# Patient Record
Sex: Male | Born: 1975 | Race: White | Hispanic: No | Marital: Single | Smoking: Never smoker
Health system: Southern US, Community
[De-identification: ages and names within clinical notes are randomized; demographics above are authoritative.]

## PROBLEM LIST (undated history)

## (undated) DIAGNOSIS — J45909 Unspecified asthma, uncomplicated: Secondary | ICD-10-CM

## (undated) DIAGNOSIS — J189 Pneumonia, unspecified organism: Secondary | ICD-10-CM

## (undated) DIAGNOSIS — I10 Essential (primary) hypertension: Secondary | ICD-10-CM

## (undated) DIAGNOSIS — R569 Unspecified convulsions: Secondary | ICD-10-CM

## (undated) DIAGNOSIS — F1920 Other psychoactive substance dependence, uncomplicated: Secondary | ICD-10-CM

## (undated) HISTORY — PX: LEG SURGERY: SHX1003

---

## 2009-08-28 ENCOUNTER — Emergency Department (HOSPITAL_COMMUNITY): Admission: EM | Admit: 2009-08-28 | Discharge: 2009-08-29 | Payer: Self-pay | Admitting: Emergency Medicine

## 2010-08-08 LAB — CBC
Platelets: 361 10*3/uL (ref 150–400)
WBC: 8.4 10*3/uL (ref 4.0–10.5)

## 2010-08-08 LAB — POCT I-STAT, CHEM 8
BUN: 9 mg/dL (ref 6–23)
Chloride: 104 mEq/L (ref 96–112)
HCT: 41 % (ref 39.0–52.0)
Sodium: 142 mEq/L (ref 135–145)
TCO2: 26 mmol/L (ref 0–100)

## 2010-08-08 LAB — DIFFERENTIAL
Eosinophils Relative: 2 % (ref 0–5)
Monocytes Absolute: 0.4 10*3/uL (ref 0.1–1.0)
Monocytes Relative: 5 % (ref 3–12)
Neutrophils Relative %: 71 % (ref 43–77)

## 2010-08-08 LAB — ETHANOL: Alcohol, Ethyl (B): <5 mg/dL (ref 0–10)

## 2010-08-08 LAB — RAPID URINE DRUG SCREEN, HOSP PERFORMED
Amphetamines: NOT DETECTED
Benzodiazepines: POSITIVE — AB

## 2014-06-07 ENCOUNTER — Emergency Department (HOSPITAL_COMMUNITY): Payer: Self-pay

## 2014-06-07 ENCOUNTER — Encounter (HOSPITAL_COMMUNITY): Payer: Self-pay | Admitting: Emergency Medicine

## 2014-06-07 ENCOUNTER — Emergency Department (HOSPITAL_COMMUNITY)
Admission: EM | Admit: 2014-06-07 | Discharge: 2014-06-08 | Disposition: A | Discharge status: Home / Self Care | Payer: Self-pay | Attending: Emergency Medicine | Admitting: Emergency Medicine

## 2014-06-07 DIAGNOSIS — F1910 Other psychoactive substance abuse, uncomplicated: Secondary | ICD-10-CM

## 2014-06-07 DIAGNOSIS — R000 Tachycardia, unspecified: Secondary | ICD-10-CM | POA: Insufficient documentation

## 2014-06-07 DIAGNOSIS — F13930 Sedative, hypnotic or anxiolytic use, unspecified with withdrawal, uncomplicated: Secondary | ICD-10-CM

## 2014-06-07 DIAGNOSIS — J45901 Unspecified asthma with (acute) exacerbation: Secondary | ICD-10-CM | POA: Insufficient documentation

## 2014-06-07 DIAGNOSIS — R062 Wheezing: Secondary | ICD-10-CM

## 2014-06-07 DIAGNOSIS — F13230 Sedative, hypnotic or anxiolytic dependence with withdrawal, uncomplicated: Secondary | ICD-10-CM

## 2014-06-07 DIAGNOSIS — Z8701 Personal history of pneumonia (recurrent): Secondary | ICD-10-CM | POA: Insufficient documentation

## 2014-06-07 DIAGNOSIS — I10 Essential (primary) hypertension: Secondary | ICD-10-CM | POA: Insufficient documentation

## 2014-06-07 DIAGNOSIS — F1110 Opioid abuse, uncomplicated: Secondary | ICD-10-CM | POA: Insufficient documentation

## 2014-06-07 DIAGNOSIS — F13239 Sedative, hypnotic or anxiolytic dependence with withdrawal, unspecified: Secondary | ICD-10-CM | POA: Insufficient documentation

## 2014-06-07 HISTORY — DX: Pneumonia, unspecified organism: J18.9

## 2014-06-07 HISTORY — DX: Essential (primary) hypertension: I10

## 2014-06-07 HISTORY — DX: Unspecified asthma, uncomplicated: J45.909

## 2014-06-07 HISTORY — DX: Other psychoactive substance dependence, uncomplicated: F19.20

## 2014-06-07 HISTORY — DX: Unspecified convulsions: R56.9

## 2014-06-07 LAB — ACETAMINOPHEN LEVEL: Acetaminophen (Tylenol), Serum: <10 ug/mL — ABNORMAL LOW (ref 10–30)

## 2014-06-07 LAB — RAPID URINE DRUG SCREEN, HOSP PERFORMED
Amphetamines: NOT DETECTED
BENZODIAZEPINES: POSITIVE — AB
Barbiturates: NOT DETECTED
Cocaine: NOT DETECTED
OPIATES: POSITIVE — AB
TETRAHYDROCANNABINOL: NOT DETECTED

## 2014-06-07 LAB — COMPREHENSIVE METABOLIC PANEL
ALBUMIN: 4.1 g/dL (ref 3.5–5.2)
ALT: 16 U/L (ref 0–53)
ANION GAP: 7 (ref 5–15)
AST: 21 U/L (ref 0–37)
Alkaline Phosphatase: 60 U/L (ref 39–117)
BILIRUBIN TOTAL: 0.5 mg/dL (ref 0.3–1.2)
BUN: 12 mg/dL (ref 6–23)
CO2: 33 mmol/L — AB (ref 19–32)
Calcium: 9.9 mg/dL (ref 8.4–10.5)
Chloride: 101 mEq/L (ref 96–112)
Creatinine, Ser: 0.82 mg/dL (ref 0.50–1.35)
GFR calc non Af Amer: >90 mL/min (ref >90)
GLUCOSE: 120 mg/dL — AB (ref 70–99)
Potassium: 4.4 mmol/L (ref 3.5–5.1)
Sodium: 141 mmol/L (ref 135–145)
Total Protein: 7.3 g/dL (ref 6.0–8.3)

## 2014-06-07 LAB — ETHANOL

## 2014-06-07 LAB — CBC WITH DIFFERENTIAL/PLATELET
BASOS ABS: 0 10*3/uL (ref 0.0–0.1)
BASOS PCT: 1 % (ref 0–1)
EOS ABS: 0.6 10*3/uL (ref 0.0–0.7)
Eosinophils Relative: 10 % — ABNORMAL HIGH (ref 0–5)
HEMATOCRIT: 38.5 % — AB (ref 39.0–52.0)
Hemoglobin: 12.7 g/dL — ABNORMAL LOW (ref 13.0–17.0)
LYMPHS ABS: 1.7 10*3/uL (ref 0.7–4.0)
LYMPHS PCT: 29 % (ref 12–46)
MCH: 28.5 pg (ref 26.0–34.0)
MCHC: 33 g/dL (ref 30.0–36.0)
MCV: 86.3 fL (ref 78.0–100.0)
MONO ABS: 0.8 10*3/uL (ref 0.1–1.0)
Monocytes Relative: 13 % — ABNORMAL HIGH (ref 3–12)
Neutro Abs: 2.7 10*3/uL (ref 1.7–7.7)
Neutrophils Relative %: 47 % (ref 43–77)
Platelets: 293 10*3/uL (ref 150–400)
RBC: 4.46 MIL/uL (ref 4.22–5.81)
RDW: 14.6 % (ref 11.5–15.5)
WBC: 5.7 10*3/uL (ref 4.0–10.5)

## 2014-06-07 LAB — SALICYLATE LEVEL

## 2014-06-07 MED ORDER — IPRATROPIUM BROMIDE 0.02 % IN SOLN
0.5000 mg | Freq: Once | RESPIRATORY_TRACT | Status: AC
  Administered 2014-06-07: 0.5 mg via RESPIRATORY_TRACT
  Filled 2014-06-07: qty 2.5

## 2014-06-07 MED ORDER — ALBUTEROL SULFATE (2.5 MG/3ML) 0.083% IN NEBU
5.0000 mg | INHALATION_SOLUTION | Freq: Once | RESPIRATORY_TRACT | Status: AC
  Administered 2014-06-07: 5 mg via RESPIRATORY_TRACT
  Filled 2014-06-07: qty 6

## 2014-06-07 MED ORDER — IBUPROFEN 200 MG PO TABS
600.0000 mg | ORAL_TABLET | Freq: Three times a day (TID) | ORAL | Status: DC | PRN
  Administered 2014-06-07: 600 mg via ORAL
  Filled 2014-06-07 (×2): qty 3

## 2014-06-07 MED ORDER — ALUM & MAG HYDROXIDE-SIMETH 200-200-20 MG/5ML PO SUSP: 30.0000 mL | ORAL | Status: DC | PRN

## 2014-06-07 MED ORDER — ALBUTEROL SULFATE (2.5 MG/3ML) 0.083% IN NEBU
5.0000 mg | INHALATION_SOLUTION | RESPIRATORY_TRACT | Status: DC | PRN
  Filled 2014-06-07: qty 6

## 2014-06-07 MED ORDER — IPRATROPIUM BROMIDE 0.02 % IN SOLN
0.5000 mg | RESPIRATORY_TRACT | Status: DC | PRN
  Filled 2014-06-07: qty 2.5

## 2014-06-07 MED ORDER — ONDANSETRON HCL 4 MG PO TABS
4.0000 mg | ORAL_TABLET | Freq: Three times a day (TID) | ORAL | Status: DC | PRN
  Administered 2014-06-08: 4 mg via ORAL
  Filled 2014-06-07: qty 1

## 2014-06-07 MED ORDER — NICOTINE 21 MG/24HR TD PT24: 21.0000 mg | MEDICATED_PATCH | Freq: Every day | TRANSDERMAL | Status: DC

## 2014-06-07 NOTE — ED Notes (Signed)
TTS at bedside. Resp treatment completed. Pt remains cooperative

## 2014-06-07 NOTE — BH Assessment (Addendum)
TTS consult was called in at 17:33; however [facility] [place] was not notified of the consult by [facility] Megahn Killings until 18:03

## 2014-06-07 NOTE — ED Notes (Addendum)
Pt requesting detox. Pt is addicted to Xanax, oxycodone, heroine. Pt stated last dosage was last night. C/o cold sweats today. Currently alert, oriented and appropriate Pt c/o cough. Moist cough and audible expiratory wheeze  noted

## 2014-06-07 NOTE — ED Notes (Signed)
PA advised of current vitals signs. Pt declined dinner
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
## 2014-06-08 DIAGNOSIS — F13930 Sedative, hypnotic or anxiolytic use, unspecified with withdrawal, uncomplicated: Secondary | ICD-10-CM | POA: Insufficient documentation

## 2014-06-08 DIAGNOSIS — F1910 Other psychoactive substance abuse, uncomplicated: Secondary | ICD-10-CM | POA: Insufficient documentation

## 2014-06-08 DIAGNOSIS — F1120 Opioid dependence, uncomplicated: Secondary | ICD-10-CM

## 2014-06-08 DIAGNOSIS — F13230 Sedative, hypnotic or anxiolytic dependence with withdrawal, uncomplicated: Secondary | ICD-10-CM | POA: Insufficient documentation

## 2014-06-08 MED ORDER — CHLORTHALIDONE 25 MG PO TABS
25.0000 mg | ORAL_TABLET | Freq: Every day | ORAL | Status: DC
  Administered 2014-06-08: 25 mg via ORAL
  Filled 2014-06-08: qty 1

## 2014-06-08 MED ORDER — LISINOPRIL 40 MG PO TABS
40.0000 mg | ORAL_TABLET | Freq: Every day | ORAL | Status: DC
  Administered 2014-06-08: 40 mg via ORAL
  Filled 2014-06-08: qty 1

## 2014-06-08 MED ORDER — CHLORDIAZEPOXIDE HCL 25 MG PO CAPS
25.0000 mg | ORAL_CAPSULE | Freq: Four times a day (QID) | ORAL | Status: DC | PRN
Start: 2014-06-08 — End: 2014-06-08

## 2014-06-08 MED ORDER — IPRATROPIUM-ALBUTEROL 0.5-2.5 (3) MG/3ML IN SOLN: 3.0000 mL | RESPIRATORY_TRACT | Status: DC | PRN

## 2014-06-08 MED ORDER — APIXABAN 5 MG PO TABS
5.0000 mg | ORAL_TABLET | Freq: Two times a day (BID) | ORAL | Status: DC
  Administered 2014-06-08: 5 mg via ORAL
  Filled 2014-06-08 (×2): qty 1
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
## 2014-06-08 NOTE — Progress Notes (Signed)
CM spoke with pt who confirms self pay [place] resident with no pcp. CM discussed and provided written information for self pay pcps, importance of pcp for f/u care, www.needymeds.org, www.goodrx.com, discounted pharmacies and other [place] resources such as [facility], [facility], affordable care act,  financial assistance, DSS and  health department  Reviewed resources for [place] self pay pcps like [facility], family medicine at [address], [facility], general medical clinics, [facility] plus others, medication resources, [facility] out patient pharmacies and housing Pt voiced understanding and appreciation of resources provided   Provided [facility] contact information   
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
## 2014-06-08 NOTE — [facility] Suicide Risk Assessment (Cosign Needed)
Suicide Risk Assessment  Discharge Assessment   [facility]   Demographic Factors:  Divorced or widowed, Caucasian, Low socioeconomic status and Unemployed  Total Time spent with patient: 30 minutes  Musculoskeletal: Strength & Muscle Tone: within normal limits Gait & Station: normal Patient leans: N/A  Psychiatric Specialty Exam:     Blood pressure 131/70, pulse 80, temperature 98.1 F (36.7 C), temperature source Oral, resp. rate 18, weight 111.131 kg (245 lb), SpO2 98 %.There is no height on file to calculate BMI.  General Appearance: Casual  Eye Contact::  Good  Speech:  Clear and Coherent and Normal Rate  Volume:  Normal  Mood:  NA  Affect:  Congruent  Thought Process:  Coherent, Goal Directed and Intact  Orientation:  Full (Time, Place, and Person)  Thought Content:  WDL  Suicidal Thoughts:  No  Homicidal Thoughts:  No  Memory:  Immediate;   Good Recent;   Good Remote;   Good  Judgement:  Good  Insight:  Good  Psychomotor Activity:  Normal  Concentration:  Good  Recall:  NA  Fund of Knowledge:Good  Language: Good  Akathisia:  NA  Handed:  Right  AIMS (if indicated):     Assets:  Desire for Improvement  Sleep:     Cognition: WNL  ADL's:  Intact      Has this patient used any form of tobacco in the last 30 days? (Cigarettes, Smokeless Tobacco, Cigars, and/or Pipes) N/A  Mental Status Per Nursing Assessment::   On Admission:     Current Mental Status by Physician: NA  Loss Factors: NA  Historical Factors: NA  Risk Reduction Factors:   Living with another person, especially a relative  Continued Clinical Symptoms:  Alcohol/Substance Abuse/Dependencies  Cognitive Features That Contribute To Risk:  Polarized thinking    Suicide Risk:  Minimal: No identifiable suicidal ideation.  Patients presenting with no risk factors but with morbid ruminations; may be classified as minimal risk based on the severity of the  depressive symptoms  Principal Problem: <principal problem not specified> Opioid Dependence and other substance abuse (Benzo's) Discharge Diagnoses:  Patient Active Problem List   Diagnosis Date Noted  . Benzodiazepine withdrawal without complication [T42.4X1A]   . Polysubstance abuse [F19.10]     Plan Of Care/Follow-up recommendations:  Activity:  As tolerated  Diet:  Regular  Is patient on multiple antipsychotic therapies at discharge:  No   Has Patient had three or more failed trials of antipsychotic monotherapy by history:  No  Recommended Plan for Multiple Antipsychotic Therapies: NA    Wylie Russon, C   PMHNP-BC 06/08/2014, 11:15 AM

## 2014-06-08 NOTE — ED Notes (Signed)
Pt discharged to self-care after requesting d/c. Pt given and signed for all belongings. AVS given with phone numbers for substance-abuse treatment. Pt verbalized feelings of safety. Escorted to lobby by this Clinical research associatewriter.

## 2014-06-08 NOTE — Discharge Instructions (Addendum)
To help you maintain a sober lifestyle, a substance abuse treatment program may be beneficial to you.  Contact the following providers to see about enrolling in their programs:  RESIDENTIAL PROGRAMS:       [facility]      [address]      [place], [place] [address]      [phone]       [facility]      [address]      [place], [place] [address]      [phone]       [facility]      [address]      [place], [place] [address]      [phone]  OUTPATIENT PROGRAMS:       [facility]      [address]      [place], [place] [address]      [phone]       [facility]      [address]      [place], [place] [address]      [phone]

## 2014-06-08 NOTE — [facility] Counselor (Signed)
[facility] Counselor called [facility] to inquire about pt's eligibility for admission given his upcoming court date. [facility] stated that they will consider pt if he can provide documentation from his attorney or PO stating that court date can be postponed. Otherwise, [facility] cannot accept pt because they do not provide transportation services to court.   [facility]S Counselor also sent referrals to the following facilities in effort to locate inpatient treatment:  [facility]  [facility]  [facility]  [facility]   [facility] was [facility] due to his upcoming court date on Fri, 06/10/14.    Cyndie MullAnna Vuk Skillern, [facility] Triage Specialist

## 2014-06-08 NOTE — ED Notes (Signed)
Pt was admitted/oriented to [facility] from [facility]. He denied SI/HI/AVH and verbalized mild feelings of discomfort r/t withdrawal. He complained of mild neck ache but declined offers of cold/heat pack. He has been calm and cooperative. Q15 minute safety checks in place. Will continue to monitor for needs/safety.

## 2015-06-05 IMAGING — CR DG CHEST 2V
2 series · 2 of 2 positions shown · non-contrast
Comparison: 03/07/2014

CLINICAL DATA: Wheezing and coughing for 1 day

EXAM:
CHEST  2 VIEW

[w chest pa]
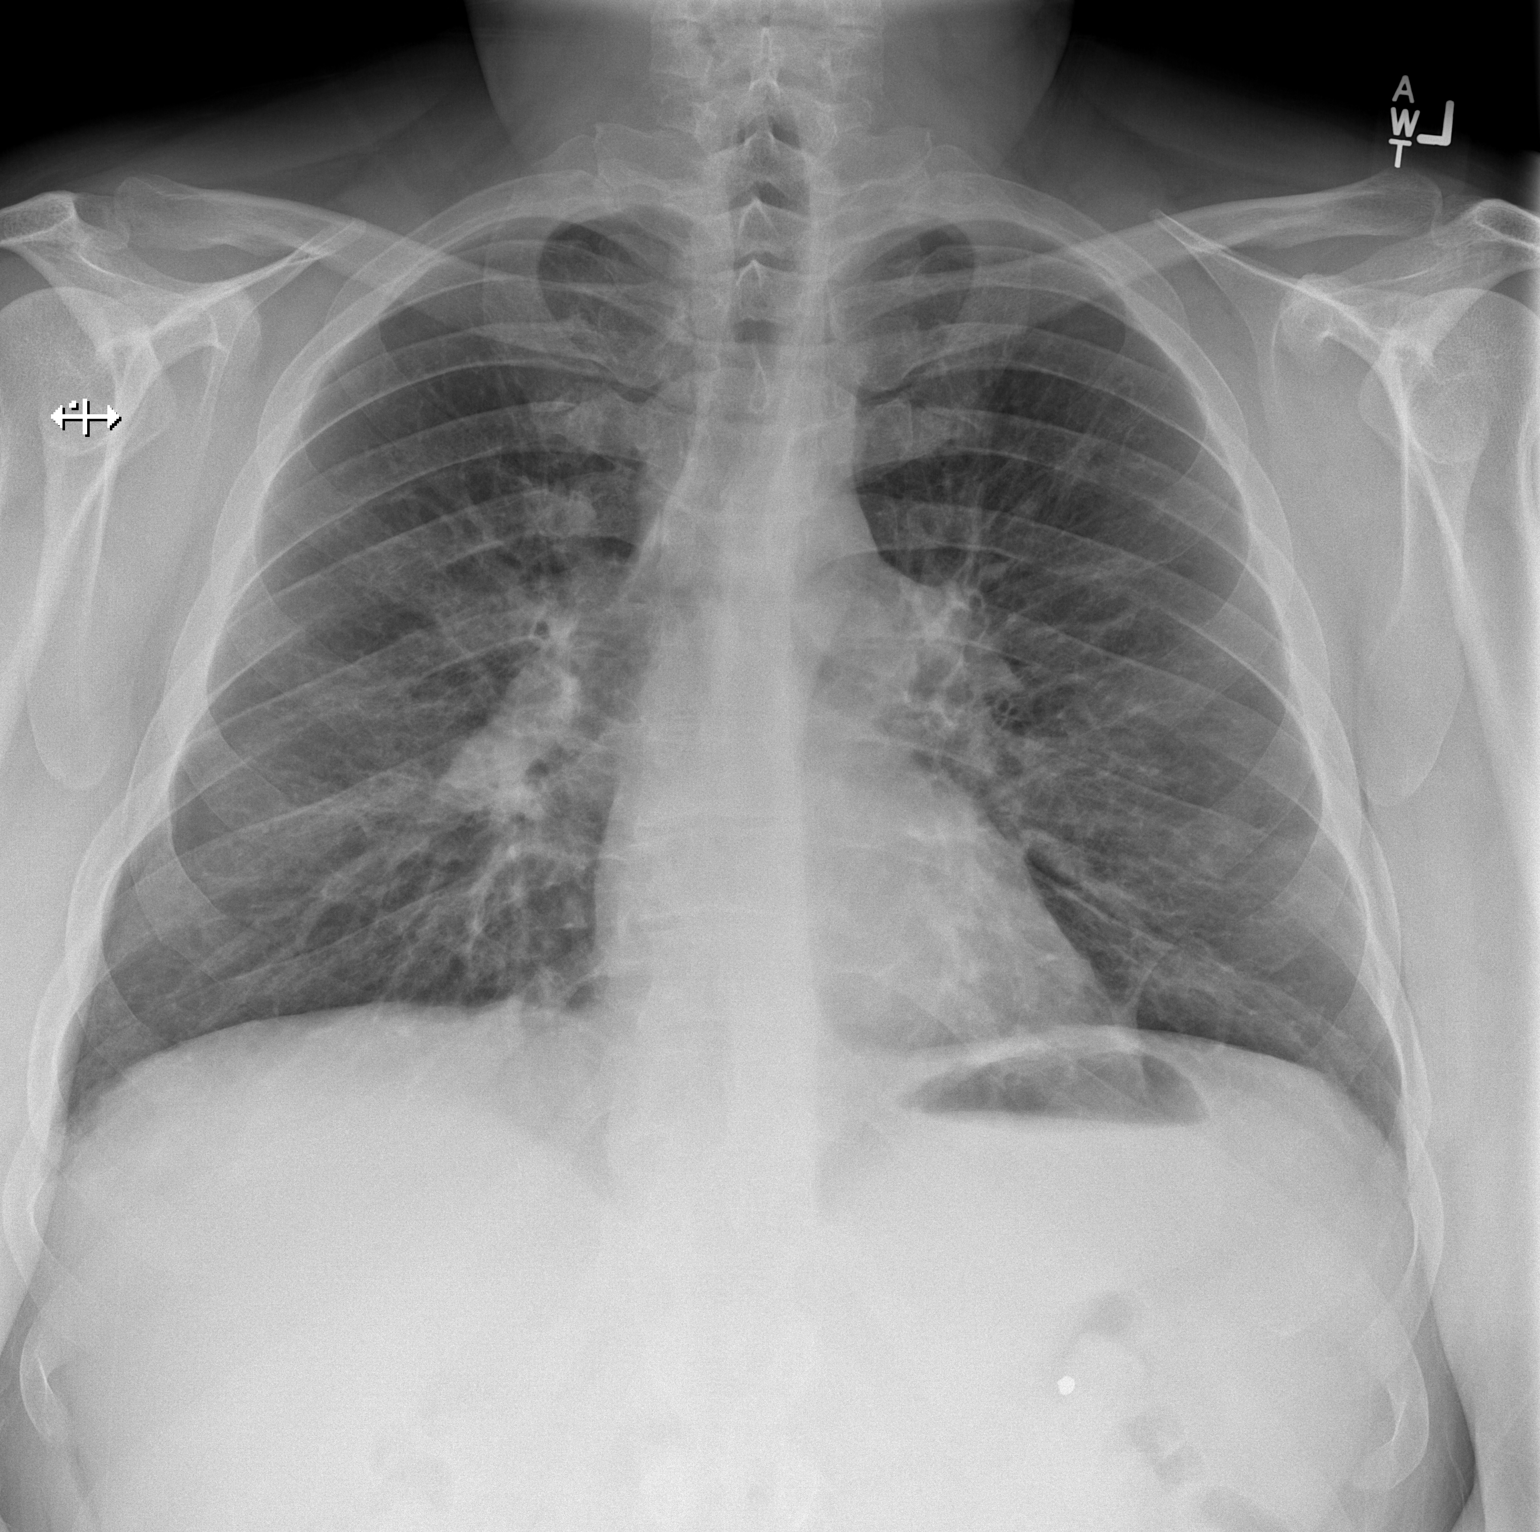

[w chest lat]
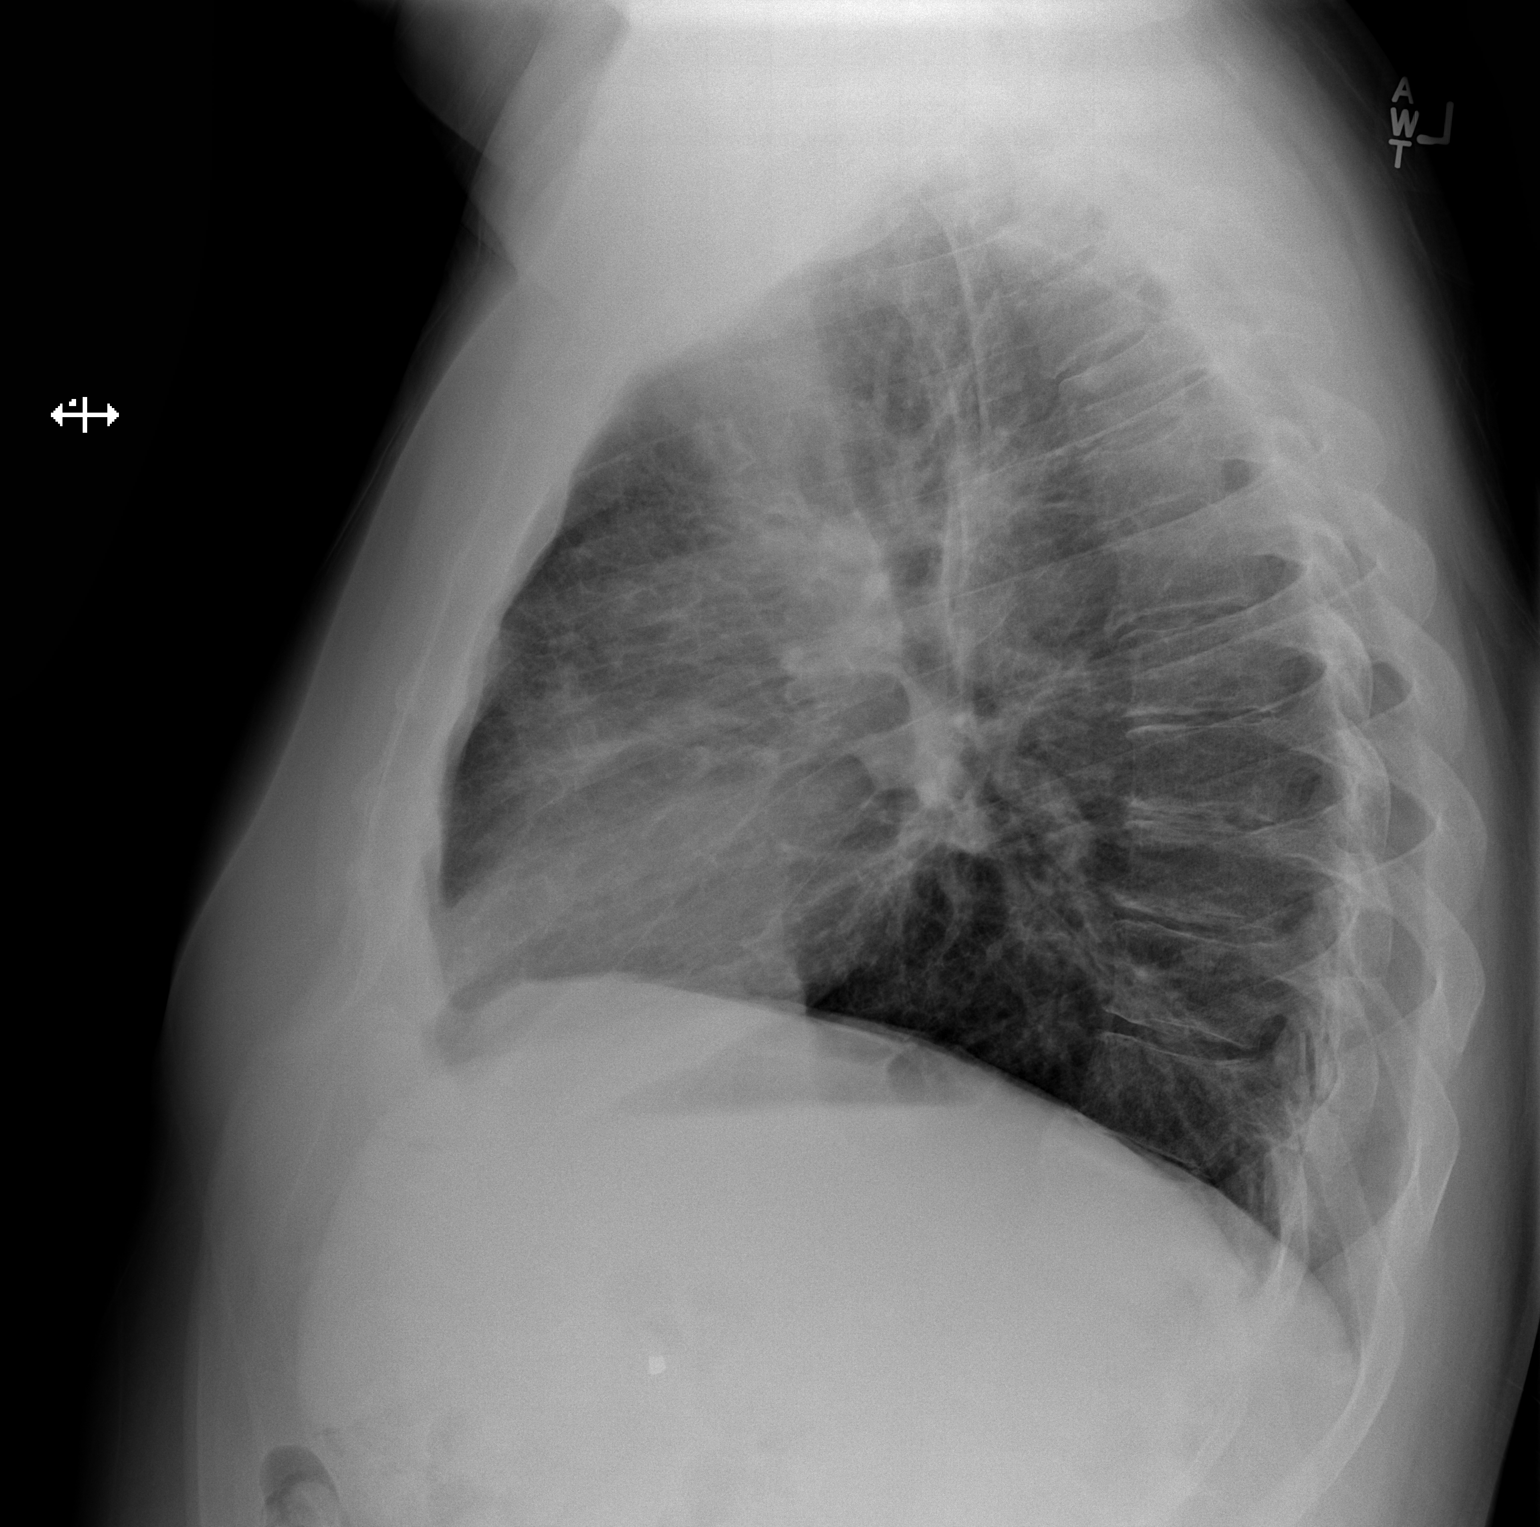

[2 of 2 positions shown; findings below may reference images not displayed]

FINDINGS: Cardiac shadow is stable. The previously seen patchy infiltrates
have resolved in the interval. Very mild interstitial changes are
seen without focal confluent infiltrate. No sizable effusion is
noted.
IMPRESSION: Mild interstitial changes which may be relating residual from
previous infiltrates. No focal acute infiltrate is seen.

## 2017-04-19 DEATH — deceased
